# Patient Record
Sex: Female | Born: 1970 | Race: Black or African American | Hispanic: No | Marital: Married | Smoking: Never smoker
Health system: Southern US, Community
[De-identification: ages and names within clinical notes are randomized; demographics above are authoritative.]

## PROBLEM LIST (undated history)

## (undated) HISTORY — PX: ABDOMINAL HYSTERECTOMY: SHX81

---

## 2018-07-16 ENCOUNTER — Encounter: Payer: Self-pay | Admitting: *Deleted

## 2018-07-16 ENCOUNTER — Emergency Department: Payer: 59

## 2018-07-16 ENCOUNTER — Other Ambulatory Visit: Payer: Self-pay

## 2018-07-16 ENCOUNTER — Emergency Department
Admission: EM | Admit: 2018-07-16 | Discharge: 2018-07-16 | Disposition: A | Discharge status: Home / Self Care | Payer: 59 | Attending: Emergency Medicine | Admitting: Emergency Medicine

## 2018-07-16 DIAGNOSIS — Z79899 Other long term (current) drug therapy: Secondary | ICD-10-CM | POA: Diagnosis not present

## 2018-07-16 DIAGNOSIS — R0789 Other chest pain: Secondary | ICD-10-CM | POA: Diagnosis present

## 2018-07-16 DIAGNOSIS — F410 Panic disorder [episodic paroxysmal anxiety] without agoraphobia: Secondary | ICD-10-CM

## 2018-07-16 DIAGNOSIS — R079 Chest pain, unspecified: Secondary | ICD-10-CM

## 2018-07-16 DIAGNOSIS — F430 Acute stress reaction: Secondary | ICD-10-CM | POA: Insufficient documentation

## 2018-07-16 DIAGNOSIS — R630 Anorexia: Secondary | ICD-10-CM | POA: Diagnosis not present

## 2018-07-16 DIAGNOSIS — G479 Sleep disorder, unspecified: Secondary | ICD-10-CM | POA: Insufficient documentation

## 2018-07-16 LAB — CBC
HCT: 38 % (ref 35.0–47.0)
HEMOGLOBIN: 12.6 g/dL (ref 12.0–16.0)
MCH: 27.5 pg (ref 26.0–34.0)
MCHC: 33.2 g/dL (ref 32.0–36.0)
MCV: 82.8 fL (ref 80.0–100.0)
Platelets: 176 10*3/uL (ref 150–440)
RBC: 4.59 MIL/uL (ref 3.80–5.20)
RDW: 13.7 % (ref 11.5–14.5)
WBC: 5.5 10*3/uL (ref 3.6–11.0)

## 2018-07-16 LAB — BASIC METABOLIC PANEL
ANION GAP: 9 (ref 5–15)
BUN: 10 mg/dL (ref 6–20)
CALCIUM: 9.3 mg/dL (ref 8.9–10.3)
CO2: 26 mmol/L (ref 22–32)
Chloride: 106 mmol/L (ref 98–111)
Creatinine, Ser: 0.68 mg/dL (ref 0.44–1.00)
GFR calc Af Amer: >60 mL/min (ref >60)
GFR calc non Af Amer: >60 mL/min (ref >60)
GLUCOSE: 118 mg/dL — AB (ref 70–99)
POTASSIUM: 3.5 mmol/L (ref 3.5–5.1)
Sodium: 141 mmol/L (ref 135–145)

## 2018-07-16 LAB — TROPONIN I
Troponin I: <0.03 ng/mL (ref <0.03)
Troponin I: <0.03 ng/mL (ref <0.03)

## 2018-07-16 LAB — POCT PREGNANCY, URINE: Preg Test, Ur: NEGATIVE

## 2018-07-16 MED ORDER — LORAZEPAM 1 MG PO TABS
1.0000 mg | ORAL_TABLET | Freq: Once | ORAL | Status: AC
  Administered 2018-07-16: 1 mg via ORAL
  Filled 2018-07-16: qty 1

## 2018-07-16 MED ORDER — LORAZEPAM 1 MG PO TABS: 1.0000 mg | ORAL_TABLET | Freq: Three times a day (TID) | ORAL | 0 refills | Status: AC | PRN

## 2018-07-16 NOTE — Discharge Instructions (Signed)
Return to the emergency department if you develop severe pain, lightheadedness or fainting, fever, as of hurting herself or anyone else, or any other symptoms concerning to you.

## 2018-07-16 NOTE — ED Provider Notes (Signed)
[facility] Emergency Department Provider Note  ____________________________________________  Time seen: Approximately 8:39 PM  I have reviewed the triage vital signs and the nursing notes.   HISTORY  Chief Complaint Chest Pain    HPI Vanessa Mcknight is a 47 y.o. female, otherwise healthy, presenting with chest pain.  The patient reports that she has recently moved down from [place] to live with her mother due to some significant personal stress.  For the past 3 weeks, she has been unable to sleep and has had no appetite.  Yesterday, the patient was at rest when she began to develop a "tightening" sensation across the entirety of her chest without radiation, and without any associated shortness of breath, palpitations, lightheadedness or syncope, diaphoresis, nausea or vomiting.  Yesterday, this pain was "off-and-on" but today it has remained there all day.  She denies any cough or cold symptoms, fevers or chills, lower extremity swelling or calf pain.  She takes a "hormone supplement, which she buys over-the-counter.  The patient has never undergone any risk stratification studies.  SH: Denies tobacco, cocaine or alcohol abuse.  FH: Father died of an MI at age 49.  History reviewed. No pertinent past medical history.  There are no active problems to display for this patient.   Past Surgical History:  Procedure Laterality Date  . ABDOMINAL HYSTERECTOMY      Current Outpatient Rx  . Order #: [id number] Class: Historical Med  . Order #: [id number] Class: Historical Med  . Order #: [id number] Class: Historical Med    Allergies Patient has no allergy information on record.  History reviewed. No pertinent family history.  Social History Social History   Tobacco Use  . Smoking status: Never Smoker  . Smokeless tobacco: Never Used  Substance Use Topics  . Alcohol use: Never    Frequency: Never  . Drug use: Never    Review of Systems Constitutional:  No fever/chills.  No lightheadedness or syncope. Eyes: No visual changes. ENT: No sore throat. No congestion or rhinorrhea. Cardiovascular: +chest pain. Denies palpitations. Respiratory: Denies shortness of breath.  No cough. Gastrointestinal: No abdominal pain.  No nausea, no vomiting.  No diarrhea.  No constipation. Genitourinary: Negative for dysuria. Musculoskeletal: Negative for back pain. Skin: Negative for rash. Neurological: Negative for headaches. No focal numbness, tingling or weakness.  Psych: Positive anxiety.  Positive situational stress.  Negative SI, HI or hallucinations.   ____________________________________________   PHYSICAL EXAM:  VITAL SIGNS: ED Triage Vitals  Enc Vitals Group     BP 07/16/18 1930 (!) 148/90     Pulse Rate 07/16/18 1931 72     Resp 07/16/18 1931 10     Temp --      Temp src --      SpO2 07/16/18 1931 100 %     Weight 07/16/18 1845 120 lb (54.4 kg)     Height 07/16/18 1845 5' 6.5" (1.689 m)     Head Circumference --      Peak Flow --      Pain Score 07/16/18 1845 8     Pain Loc --      Pain Edu? --      Excl. in GC? --      Constitutional: Alert and oriented. Answers questions appropriately.  She is appearing but nontoxic. Eyes: Conjunctivae are normal.  EOMI. No scleral icterus. Head: Atraumatic. Nose: No congestion/rhinnorhea. Mouth/Throat: Mucous membranes are moist.  Neck: No stridor.  Supple.  No JVD.  No meningismus. Cardiovascular: Normal  rate, regular rhythm. No murmurs, rubs or gallops.  Respiratory: Normal respiratory effort.  No accessory muscle use or retractions. Lungs CTAB.  No wheezes, rales or ronchi. Gastrointestinal: Soft, nontender and nondistended.  No guarding or rebound.  No peritoneal signs. Musculoskeletal: No LE edema. No ttp in the calves or palpable cords.  Negative Homan's sign. Neurologic:  A&Ox3.  Speech is clear.  Face and smile are symmetric.  EOMI.  Moves all extremities well. Skin:  Skin is warm,  dry and intact. No rash noted. Psychiatric: The patient has a depressed mood with an anxious affect.  She actively denies any SI, HI or hallucinations on my examination.  ____________________________________________   LABS (all labs ordered are listed, but only abnormal results are displayed)  Labs Reviewed  BASIC METABOLIC PANEL - Abnormal; Notable for the following components:      Result Value   Glucose, Bld 118 (*)    All other components within normal limits  TROPONIN I  CBC  TROPONIN I  POC URINE PREG, ED  POCT PREGNANCY, URINE   ____________________________________________  EKG  ED ECG REPORT I, Anne-Caroline Sharma Covert, the attending physician, personally viewed and interpreted this ECG.   Date: 07/16/2018  EKG Time: 1839  Rate: 77  Rhythm: normal sinus rhythm  Axis: normal  Intervals:none  ST&T Change: No STEMI  ____________________________________________  RADIOLOGY  Dg Chest 2 View  Result Date: 07/16/2018 CLINICAL DATA:  Acute onset of generalized chest pain that began last night. Recent fatigue and anorexia. EXAM: CHEST - 2 VIEW COMPARISON:  None. FINDINGS: Cardiomediastinal silhouette unremarkable. Lungs clear. Bronchovascular markings normal. Pulmonary vascularity normal. No visible pleural effusions. No pneumothorax. Visualized bony thorax intact. IMPRESSION: No acute cardiopulmonary disease. Electronically Signed   By: Hulan Saas M.D.   On: 07/16/2018 19:10    ____________________________________________   PROCEDURES  Procedure(s) performed: None  Procedures  Critical Care performed: No ____________________________________________   INITIAL IMPRESSION / ASSESSMENT AND PLAN / ED COURSE  Pertinent labs & imaging results that were available during my care of the patient were reviewed by me and considered in my medical decision making (see chart for details).  47 y.o. female, otherwise healthy, presenting for chest pain.  Overall, the  patient gives a history that is most consistent with stress-induced anxiety.  Personally, this patient does not have a significant number of risk factors other than her family history.  Today, she is very anxious and mildly hypertensive.  Her work-up in the emergency department has been reassuring.  Her EKG does not show any ischemic changes or arrhythmias.  Her troponin has been negative and a second troponin is pending at this time.  The remainder of her electrolytes are within normal limits, even though she has not been eating or drinking normally.  I do not suspect PE or aortic pathology.  Patient's chest x-ray shows no cardiopulmonary process.  I have talked to the patient and her mother at length about treatment and evaluation for anxiety, and will refer her to the [place] [facility].  In addition, I will give her the information for the cardiologist as she has not had any risk stratification studies in the past.  Plan discharge.  ____________________________________________  FINAL CLINICAL IMPRESSION(S) / ED DIAGNOSES  Final diagnoses:  Panic attack as reaction to stress  Anorexia  Sleep disturbance  Chest pain, unspecified type         NEW MEDICATIONS STARTED DURING THIS VISIT:  New Prescriptions   No medications on file  Rockne Menghini, MD 07/16/18 [phone]

## 2018-07-16 NOTE — ED Notes (Signed)
Pt tells this RN that she has CP all the way across her chest that began yesterday. Pt states she isn't eating or sleeping. . Denies fever, N&V. Denies abd pain. No SOB noted. Appears anxious. Family member with pt.   Alert, oriented, was ambulatory to room without difficulty.

## 2018-07-16 NOTE — ED Triage Notes (Signed)
Pt reporting generalized chest pain that started last night. Increased fatigue and decreased appetite reported. PT verbalized increased anxiety for the past few months with inability to sleep through the night for the past couple months. Pt denies having been seen for anxiety before but never prescribed medications.

## 2018-08-18 ENCOUNTER — Encounter (HOSPITAL_BASED_OUTPATIENT_CLINIC_OR_DEPARTMENT_OTHER): Payer: Self-pay

## 2018-08-18 NOTE — Progress Notes (Signed)
[facility] (PHP) Referral    Date/Time:08/18/2018, 1:03 PM  Interviewer:Nashira Mcglynn Hermelinda Medicus    Patient's Name (Always update Registration for Patient):Natasha Powers    Patient's Telephone Number (s): [phone] (home)    Emergency Contact Name /Relationship/Telephone Number:David Beecham (Spouse)  [phone]  Can we speak to this person regarding your admission to this treatment episode?   yes    Referring Provider's Name and Telephone Number:Dr Royce Macadamia [phone]    Does the patient require Hard of Hearing Services? no     Any medical issues that may interfere with physical mobility or treatment?  no    Are you Pregnant? no    (If YES, Schedule the patient into the earliest appointment slot and email assessment therapist's manager for support with an early appointment if possible.)    Does the patient require Language Services?  no    (Set up an interpreter for the assessment appointment & note in appointment notes. Please input in request contact information for assessment therapist.  Send confirmation email to therapist and manager)      Insurance Information:  Company:[employer]  Policy Holder [facility]): self  Relationship to PT: self  PH DOB: 2071/04/05  Member VH:[id number]  Group #: [id number]  Insured Employer: [employer]  Phone number: Providers/Mental Health/SA: [phone]  Insurance Address /[address]: [phone]        I need to ask some safety questions before continuing with the screening process:    Have you had thoughts about harming/killing yourself within the last month?  no   ? If YES what were the circumstances?n/a  ? When was the last time within the 30 days that you felt this way?n/a  ? What stopped you from harming/killing yourself?n/a    Have you wished you were dead or wished you could go to sleep and not wake up within the last month?  no   Have you had any prior suicide attempt in the past?  no   If YES, WHEN, HOW did you attempt suicide and did you seek treatment (did  you get hospitalized)? n/a    Have you had thoughts about harming/killing others with the last month?  no   ? If YES what were the circumstances? n/a  ? When was the last time within the 30days that you felt this way? n/a  ? What stopped you from harming/killing others? n/a    Clinical Information    Presenting Problem: Why are you seeking help? Pt reports dealing with chronic stress which has led to other issues. Pt reports after 3 visits doctor recommended PHP    Are you presently receiving mental health services from an outpatient provider?  (Psychiatrist, Primary Care Provider - prescribing psychotropic medications, psychologist, and therapist)?  yes   ? IF YES:   Name of Provider: Dr Royce Macadamia [phone]  Discipline (Psychiatrist, Psychologist, PCP, and therapist:psychatrist   Most recent Appointment (Past or Future)?     Ottis Stain, Counselor [phone] 08/12/18    Substance/Drug Use    Are you currently using alcohol or drugs?  no   If YES please complete for all reported alcohol or drugs    Drug:n/a  Pattern (Route, Amount) of use:n/a  Date of last Use:n/a    Drug:  Pattern (Route, Amount) of use:  Date of last Use:    Drug:  Pattern (Route, Amount) of use:  Date of last Use:    Current or Past Withdrawal Symptoms? List  Current:n/a  Past:n/a  PHP requires that you DO NOT USE alcohol, or misuse prescription or non-prescription drugs while in the program. Do you have any concerns about your ability to do this?    no   (If Patient says YES, this can be explored more with the assessment therapist)      Legal  Do you have any legal issues? Current or pending charges?  no   If YES, do you have pending court date? no       Additional Comment:n/a      Recommended Level of Care: Pt is requesting PHP    Appointment with: Dede Query  Location: [place]   Date: 08/27/18  Time: 11:30      Reminder:   ? Please bring a list of all prescription medication to include dosages to your scheduled appointment.    ? We ask that you give [place] 24 hour notice if you need to cancel or reschedule your appointment.  ? Late cancellation or no shows may be charged a $45 no show charge.  ? Please note that [facility] will be calling you to discuss your upcoming appointment. It is important that your Voicemail is cleared so that you are able to receive messages.

## 2018-08-27 ENCOUNTER — Encounter (HOSPITAL_BASED_OUTPATIENT_CLINIC_OR_DEPARTMENT_OTHER): Payer: Commercial Managed Care - POS

## 2018-08-27 ENCOUNTER — Encounter (HOSPITAL_BASED_OUTPATIENT_CLINIC_OR_DEPARTMENT_OTHER): Payer: Self-pay

## 2018-11-25 ENCOUNTER — Encounter (HOSPITAL_BASED_OUTPATIENT_CLINIC_OR_DEPARTMENT_OTHER): Payer: Self-pay

## 2018-11-25 NOTE — Progress Notes (Signed)
[facility] [facility] (PHP) Referral    Date/Time:11/25/2018, 12:13 PM  Interviewer:Marie-Noelle Donnis Pecha    Patient's Name (Always update Registration for Patient):Natasha Powers    Patient's Telephone Number (s): [phone] (home)  , self  Emergency Contact Name /Relationship/Telephone Number:husband/David Kabat/[phone]   Can we speak to this person regarding your admission to this treatment episode?   yes    Referring Provider's Name and Telephone Number:Psychiatrist, Husam Alathari    Does the patient require Hard of Hearing Services? no     Any medical issues that may interfere with physical mobility or treatment?  no    Are you Pregnant?  No    (If YES, Schedule the patient into the earliest appointment slot and email assessment therapist's manager for support with an early appointment if possible.)    Does the patient require Language Services?  no    (Set up an interpreter for the assessment appointment & note in appointment notes. Please input in request contact information for assessment therapist.  Send confirmation email to therapist and manager)      Insurance Information:  Company:[employer]  Policy Holder [facility]): self     Relationship to PT: self  PH DOB: [birth year]  Member GN:[id number]  Group #: [id number]  Insured Employer: [employer] number: Providers/Mental Health/SA: [phone]  Insurance Address /[address]: [phone]        I need to ask some safety questions before continuing with the screening process:    Have you had thoughts about harming/killing yourself within the last month?  no   ? If YES what were the circumstances?NA   ? When was the last time within the 30 days that you felt this way?NA  ? What stopped you from harming/killing yourself?NA    Have you wished you were dead or wished you could go to sleep and not wake up within the last month?  no   Have you had any prior suicide attempt in the past?  no   If YES, WHEN, HOW did you attempt suicide and did you seek treatment  (did you get hospitalized)? NA    Have you had thoughts about harming/killing others with the last month?  no   ? If YES what were the circumstances? NA  ? When was the last time within the 30days that you felt this way? NA  ? What stopped you from harming/killing others? NA    Clinical Information    Presenting Problem: Why are you seeking help? Patient stated "I was going through stress and had to take a leave of absence from work Now I am also struggling with anxiety depression and panic attacks. The psychiatrist prescribed medications that are not working"    Are you presently receiving mental health services from an outpatient provider?  (Psychiatrist, Primary Care Provider - prescribing psychotropic medications, psychologist, and therapist)?  yes   ? IF YES:   Name of Provider: Dr. Derryl Harbor, in [place], [place]  Discipline (Psychiatrist, Psychologist, PCP, and therapist):psychiatrist  Most recent Appointment (Past or Future)? Last month      Substance/Drug Use    Are you currently using alcohol or drugs?  no   If YES please complete for all reported alcohol or drugs    Drug:NA  Pattern (Route, Amount) of use:  Date of last Use:       PHP requires that you DO NOT USE alcohol, or misuse prescription or non-prescription drugs while in the program. Do you have any concerns about your  ability to do this?    no   (If Patient says YES, this can be explored more with the assessment therapist)      Legal  Do you have any legal issues? Current or pending charges?  no   If YES, do you have pending court date?  NA      Additional Comment:NA      Recommended Level of Care: PHP    Appointment with:Kathryn Burton Apley  Location: [facility]  Date:12/17/2018  Time:3 pm      Reminder:   ? Please bring a list of all prescription medication to include dosages to your scheduled appointment.   ? We ask that you give [place] 24 hour notice if you need to cancel or reschedule your appointment.  ? Late cancellation or no shows may be charged a $45 no show  charge.  ? Please note that [facility] will be calling you to discuss your upcoming appointment. It is important that your Voicemail is cleared so that you are able to receive messages.

## 2018-12-17 ENCOUNTER — Encounter (HOSPITAL_BASED_OUTPATIENT_CLINIC_OR_DEPARTMENT_OTHER): Payer: Commercial Managed Care - POS

## 2020-01-05 IMAGING — CR DG CHEST 2V
1 series · 3 of 3 positions shown · non-contrast
Comparison: None.

CLINICAL DATA: Acute onset of generalized chest pain that began
last night. Recent fatigue and anorexia.

EXAM:
CHEST - 2 VIEW

[Series 1: dg chest 2 view · 0.14mm/px · 3 of 3 slices shown]
[im 1/3]
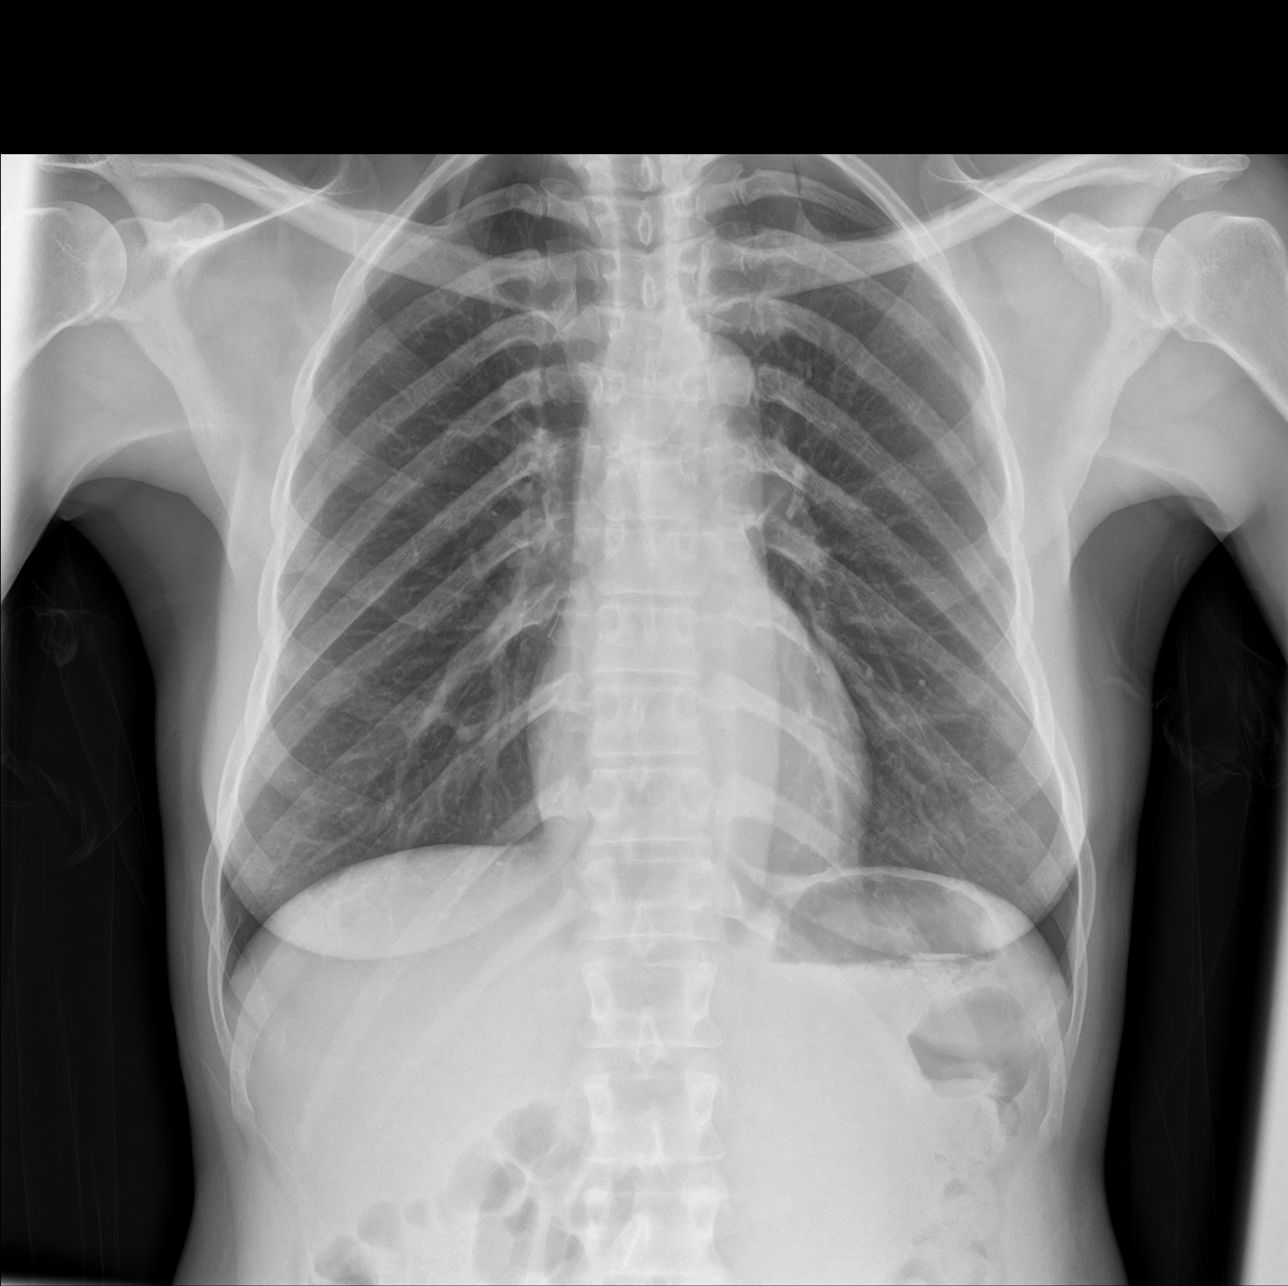
[im 2/3]
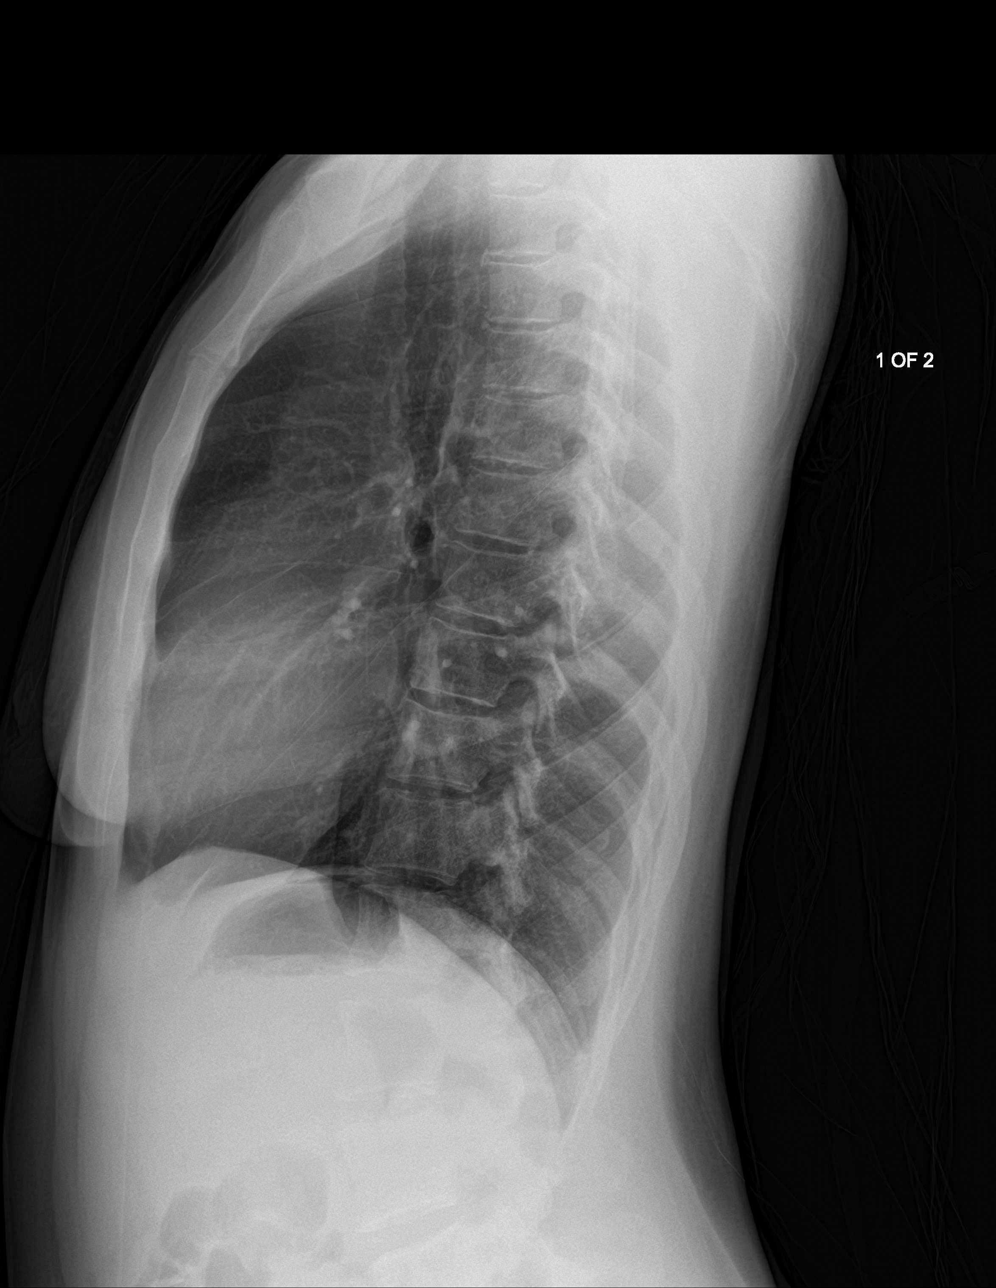
[im 3/3]
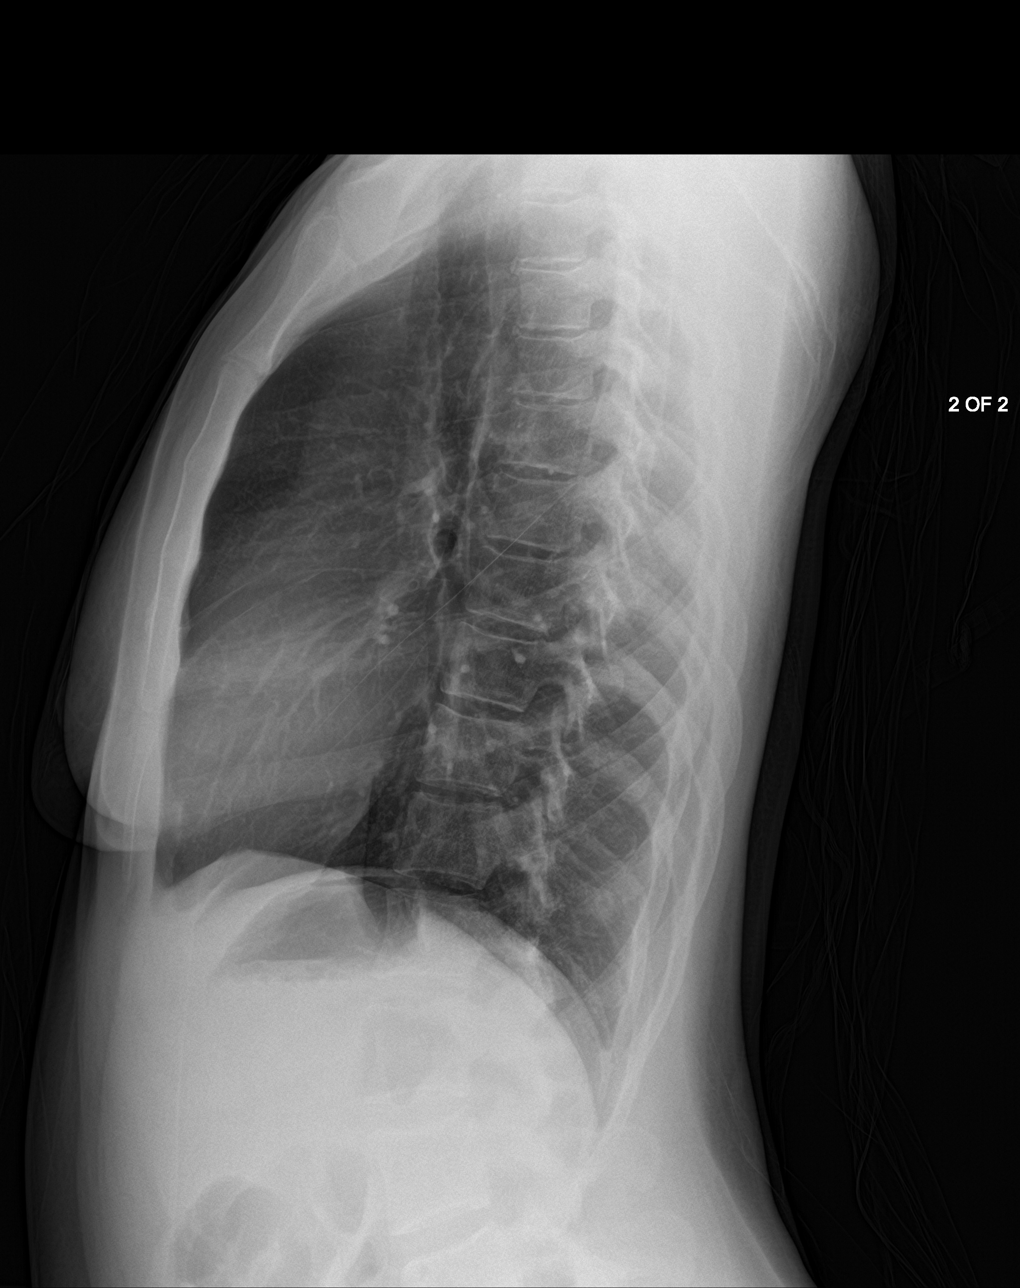

[3 of 3 positions shown; findings below may reference images not displayed]

FINDINGS: Cardiomediastinal silhouette unremarkable. Lungs clear.
Bronchovascular markings normal. Pulmonary vascularity normal. No
visible pleural effusions. No pneumothorax. Visualized bony thorax
intact.
IMPRESSION: No acute cardiopulmonary disease.

## 2022-11-06 ENCOUNTER — Encounter (FREE_STANDING_LABORATORY_FACILITY): Payer: Commercial Managed Care - POS

## 2022-11-06 ENCOUNTER — Ambulatory Visit: Payer: Self-pay

## 2022-11-06 DIAGNOSIS — J3489 Other specified disorders of nose and nasal sinuses: Secondary | ICD-10-CM

## 2022-11-06 LAB — SURGICAL PATHOLOGY EXAM

## 2022-11-13 LAB — LAB USE ONLY - HISTORICAL SURGICAL PATHOLOGY
